# Patient Record
Sex: Female | Born: 2000 | Race: Black or African American | Hispanic: No | Marital: Single | Smoking: Never smoker
Health system: Southern US, Community
[De-identification: ages and names within clinical notes are randomized; demographics above are authoritative.]

## PROBLEM LIST (undated history)

## (undated) DIAGNOSIS — B009 Herpesviral infection, unspecified: Secondary | ICD-10-CM

## (undated) DIAGNOSIS — L0292 Furuncle, unspecified: Secondary | ICD-10-CM

## (undated) DIAGNOSIS — G43909 Migraine, unspecified, not intractable, without status migrainosus: Secondary | ICD-10-CM

## (undated) DIAGNOSIS — L02419 Cutaneous abscess of limb, unspecified: Secondary | ICD-10-CM

## (undated) DIAGNOSIS — L0293 Carbuncle, unspecified: Secondary | ICD-10-CM

## (undated) DIAGNOSIS — L659 Nonscarring hair loss, unspecified: Secondary | ICD-10-CM

## (undated) DIAGNOSIS — K649 Unspecified hemorrhoids: Secondary | ICD-10-CM

## (undated) DIAGNOSIS — R87629 Unspecified abnormal cytological findings in specimens from vagina: Secondary | ICD-10-CM

## (undated) HISTORY — DX: Unspecified hemorrhoids: K64.9

## (undated) HISTORY — DX: Unspecified abnormal cytological findings in specimens from vagina: R87.629

## (undated) HISTORY — PX: INCISION AND DRAINAGE: SHX5863

## (undated) HISTORY — DX: Herpesviral infection, unspecified: B00.9

## (undated) HISTORY — DX: Migraine, unspecified, not intractable, without status migrainosus: G43.909

---
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
## 2022-09-06 LAB — OB RESULTS CONSOLE GC/CHLAMYDIA
Chlamydia: NEGATIVE
Neisseria Gonorrhea: NEGATIVE

## 2022-09-06 LAB — OB RESULTS CONSOLE HEPATITIS B SURFACE ANTIGEN: Hepatitis B Surface Ag: NEGATIVE

## 2022-09-06 LAB — OB RESULTS CONSOLE ANTIBODY SCREEN: Antibody Screen: NEGATIVE

## 2022-09-06 LAB — OB RESULTS CONSOLE HIV ANTIBODY (ROUTINE TESTING): HIV: NONREACTIVE

## 2022-09-06 LAB — OB RESULTS CONSOLE RPR: RPR: NONREACTIVE

## 2022-09-06 LAB — OB RESULTS CONSOLE ABO/RH: RH Type: POSITIVE

## 2022-09-06 LAB — HEPATITIS C ANTIBODY: HCV Ab: NEGATIVE

## 2022-09-06 LAB — OB RESULTS CONSOLE RUBELLA ANTIBODY, IGM: Rubella: IMMUNE

## 2022-10-30 NOTE — L&D Delivery Note (Signed)
Delivery Note At 11:04 AM a viable female was delivered via Vaginal, Spontaneous (Presentation: Left Occiput Anterior).  APGAR: 9, 9; weight  .   Placenta status: Spontaneous, Intact.  Cord: 3 vessels with the following complications:   Nuchal cord noted and reduced mid delivery  Anesthesia: Epidural Episiotomy: None Lacerations: 1st degree Suture Repair: 2.0 vicryl rapide Est. Blood Loss (mL):  250 cc  Mom to postpartum.  Baby to Couplet care / Skin to Skin.  Placenta to path for IUGR  [name] 03/23/2023, 11:24 AM

## 2022-12-14 ENCOUNTER — Other Ambulatory Visit: Payer: Self-pay | Admitting: Obstetrics and Gynecology

## 2022-12-14 DIAGNOSIS — Z363 Encounter for antenatal screening for malformations: Secondary | ICD-10-CM

## 2022-12-19 ENCOUNTER — Encounter: Payer: Self-pay | Admitting: *Deleted

## 2022-12-21 ENCOUNTER — Ambulatory Visit: Payer: BLUE CROSS/BLUE SHIELD | Attending: Obstetrics and Gynecology

## 2022-12-21 ENCOUNTER — Other Ambulatory Visit: Payer: Self-pay | Admitting: *Deleted

## 2022-12-21 ENCOUNTER — Encounter: Payer: Self-pay | Admitting: *Deleted

## 2022-12-21 ENCOUNTER — Ambulatory Visit: Payer: BLUE CROSS/BLUE SHIELD | Admitting: *Deleted

## 2022-12-21 VITALS — BP 118/63 | HR 90

## 2022-12-21 DIAGNOSIS — Z363 Encounter for antenatal screening for malformations: Secondary | ICD-10-CM | POA: Diagnosis not present

## 2022-12-21 DIAGNOSIS — Z3689 Encounter for other specified antenatal screening: Secondary | ICD-10-CM | POA: Diagnosis present

## 2022-12-21 DIAGNOSIS — O35BXX0 Maternal care for other (suspected) fetal abnormality and damage, fetal cardiac anomalies, not applicable or unspecified: Secondary | ICD-10-CM | POA: Diagnosis not present

## 2022-12-21 DIAGNOSIS — Z3A27 27 weeks gestation of pregnancy: Secondary | ICD-10-CM

## 2022-12-21 DIAGNOSIS — O365920 Maternal care for other known or suspected poor fetal growth, second trimester, not applicable or unspecified: Secondary | ICD-10-CM

## 2022-12-21 DIAGNOSIS — O283 Abnormal ultrasonic finding on antenatal screening of mother: Secondary | ICD-10-CM

## 2023-01-15 ENCOUNTER — Encounter: Payer: Self-pay | Admitting: *Deleted

## 2023-01-15 ENCOUNTER — Ambulatory Visit: Payer: BLUE CROSS/BLUE SHIELD | Admitting: *Deleted

## 2023-01-15 ENCOUNTER — Ambulatory Visit: Payer: BLUE CROSS/BLUE SHIELD | Attending: Maternal & Fetal Medicine

## 2023-01-15 DIAGNOSIS — Z3689 Encounter for other specified antenatal screening: Secondary | ICD-10-CM | POA: Insufficient documentation

## 2023-01-15 DIAGNOSIS — O35BXX0 Maternal care for other (suspected) fetal abnormality and damage, fetal cardiac anomalies, not applicable or unspecified: Secondary | ICD-10-CM

## 2023-01-15 DIAGNOSIS — Z3A30 30 weeks gestation of pregnancy: Secondary | ICD-10-CM | POA: Diagnosis not present

## 2023-01-15 DIAGNOSIS — O283 Abnormal ultrasonic finding on antenatal screening of mother: Secondary | ICD-10-CM | POA: Diagnosis present

## 2023-01-15 DIAGNOSIS — O365930 Maternal care for other known or suspected poor fetal growth, third trimester, not applicable or unspecified: Secondary | ICD-10-CM | POA: Diagnosis not present

## 2023-01-15 NOTE — Congregational Nurse Program (Signed)
  Dept: [phone]   Congregational Nurse Program Note  Date of Encounter: 12/31/2022  Past Medical History: Past Medical History:  Diagnosis Date   Abscess of axilla 07/2012   multiple abscesses of both axilla - one axilla is open and draining   Alopecia    Hemorrhoids    HSV infection    Migraines    Recurrent boils    Vaginal Pap smear, abnormal     Encounter Details:  CNP Questionnaire - 01/15/23 1724       Questionnaire   Ask client: Do you give verbal consent for me to treat you today? Yes    Student Assistance N/A    Location Patient [place]    Visit Setting with Client [place]    Patient Status Unknown    Sport and exercise psychologist or [place]    Insurance/Financial Assistance Referral N/A    Medication N/A    Medical Provider Yes    Screening Referrals Made N/A    Medical Referrals Made N/A    Medical Appointment Made N/A    Recently w/o PCP, now 1st time PCP visit completed due to CNs referral or appointment made N/A    Food N/A    Transportation N/A    Housing/Utilities N/A    Interpersonal Safety N/A    Interventions Advocate/Support;Educate;Spiritual Care;Counsel    Abnormal to Normal Screening Since Last CN Visit N/A    Screenings CN Performed Blood Pressure;Pulse Ox;Temperature    Sent Client to Lab for: N/A    Did client attend any of the following based off CNs referral or appointments made? N/A    ED Visit Averted N/A    Life-Saving Intervention Made N/A             Today's Vitals   12/31/22 1315  BP: 106/62  Pulse: 83  Resp: 18  Temp: 97.9 F (36.6 C)  TempSrc: Temporal  SpO2: 100%  PainSc: 0-No pain   There is no height or weight on file to calculate BMI.  Patient came into clinic to congregational nurse. Observed patient was pregnant. Patient  states due date Mar 21, 2023. Spiritual care given. Discussed with patient the transitions of life. Patient expressed having some round ligament pain. Education given to  assist with round ligament pain. Encouraged patient to discuss with PCP about inability to sleep at night. Encouraged patient to have decreased caffeine while pregnant. Dietary education given.

## 2023-02-27 LAB — OB RESULTS CONSOLE GBS: GBS: POSITIVE

## 2023-03-20 NOTE — H&P (Addendum)
Barbara Holland is a 22 y.o. female presenting for IOL. PNC complicated by SGA on early U/S. MFM U/S noted EFW 22% @ 30 wks. U/S in office 03/20/23 noted Vtx, EFW 6# 4oz (4%), AC <3%, AFI 55%, BPP 8/8. 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 OB History     Gravida  1   Para  0   Term  0   Preterm  0   AB  0   Living  0      SAB  0   IAB  0   Ectopic  0   Multiple  0   Live Births  0          Past Medical History:  Diagnosis Date   Abscess of axilla 07/2012   multiple abscesses of both axilla - one axilla is open and draining   Alopecia    Hemorrhoids    HSV infection    Migraines    Recurrent boils    Vaginal Pap smear, abnormal    Past Surgical History:  Procedure Laterality Date   INCISION AND DRAINAGE     for boils   MASS EXCISION  08/29/2012   Procedure: EXCISION MASS;  Surgeon: Judie Petit. Leonia Corona, MD;  Location: [facility];  Service: Pediatrics;  Laterality: Bilateral;  INCISION AND DEBRIDEMENT OF MULTIPLE ABSCESSES BOTH AXILLAS    Family History: family history includes Cancer (age of onset: 24) in her paternal grandfather; Diabetes in her maternal grandfather, maternal grandmother, and mother; Heart disease in her maternal grandfather, maternal grandmother, and paternal grandmother; Hyperlipidemia in her mother; Hypertension in her maternal grandfather, maternal grandmother, and mother; Kidney disease in her maternal grandmother; Sickle cell trait in her paternal uncle; Stroke in her maternal grandfather and maternal grandmother; Thyroid disease in her mother. Social History:  reports that she has never smoked. She has never used smokeless tobacco. She reports that she does not drink alcohol and does not use drugs.     Maternal Diabetes: No Genetic Screening: Declined Maternal Ultrasounds/Referrals: IUGR Fetal  Ultrasounds or other Referrals:  Referred to Materal Fetal Medicine  Maternal Substance Abuse:  No Significant Maternal Medications:  Meds include: Other: Valtrex Significant Maternal Lab Results:  Group B Strep positive Number of Prenatal Visits:greater than 3 verified prenatal visits Other Comments:  None  Review of Systems  Constitutional:  Negative for fever.  Eyes:  Negative for visual disturbance.  Neurological:  Negative for headaches.   Maternal Medical History:  Fetal activity: Perceived fetal activity is normal.       Last menstrual period 06/18/2022. Maternal Exam:  Abdomen: Fetal presentation: vertex   Physical Exam Cardiovascular:     Rate and Rhythm: Normal rate.  Pulmonary:     Effort: Pulmonary effort is normal.    Cx 1/30/-3 in office  Prenatal labs: ABO, Rh:   Antibody:   Rubella:   RPR:    HBsAg:    HIV:    GBS:   positive 02/27/23  Assessment/Plan: 22 yo G1P0 IUGR IOL   Barbara Holland 03/20/2023, 5:00 PM

## 2023-03-21 ENCOUNTER — Telehealth (HOSPITAL_COMMUNITY): Payer: Self-pay | Admitting: *Deleted

## 2023-03-21 NOTE — Telephone Encounter (Unsigned)
Preadmission screen  

## 2023-03-22 ENCOUNTER — Other Ambulatory Visit: Payer: Self-pay

## 2023-03-22 ENCOUNTER — Inpatient Hospital Stay (HOSPITAL_COMMUNITY)
Admission: RE | Admit: 2023-03-22 | Discharge: 2023-03-25 | DRG: 806 | Disposition: A | Discharge status: Home / Self Care | Payer: BLUE CROSS/BLUE SHIELD | Attending: Obstetrics and Gynecology | Admitting: Obstetrics and Gynecology

## 2023-03-22 ENCOUNTER — Encounter (HOSPITAL_COMMUNITY): Payer: Self-pay | Admitting: Obstetrics and Gynecology

## 2023-03-22 ENCOUNTER — Inpatient Hospital Stay (HOSPITAL_COMMUNITY): Payer: BLUE CROSS/BLUE SHIELD

## 2023-03-22 DIAGNOSIS — Z3A40 40 weeks gestation of pregnancy: Secondary | ICD-10-CM | POA: Diagnosis not present

## 2023-03-22 DIAGNOSIS — O99824 Streptococcus B carrier state complicating childbirth: Secondary | ICD-10-CM | POA: Diagnosis present

## 2023-03-22 DIAGNOSIS — O9832 Other infections with a predominantly sexual mode of transmission complicating childbirth: Secondary | ICD-10-CM | POA: Diagnosis present

## 2023-03-22 DIAGNOSIS — A6000 Herpesviral infection of urogenital system, unspecified: Secondary | ICD-10-CM | POA: Diagnosis present

## 2023-03-22 DIAGNOSIS — O365930 Maternal care for other known or suspected poor fetal growth, third trimester, not applicable or unspecified: Principal | ICD-10-CM | POA: Diagnosis present

## 2023-03-22 DIAGNOSIS — O365990 Maternal care for other known or suspected poor fetal growth, unspecified trimester, not applicable or unspecified: Principal | ICD-10-CM | POA: Diagnosis present

## 2023-03-22 LAB — RPR: RPR Ser Ql: NONREACTIVE

## 2023-03-22 LAB — CBC
HCT: 35.2 % — ABNORMAL LOW (ref 36.0–46.0)
Hemoglobin: 12.1 g/dL (ref 12.0–15.0)
MCH: 30.9 pg (ref 26.0–34.0)
MCHC: 34.4 g/dL (ref 30.0–36.0)
MCV: 90 fL (ref 80.0–100.0)
Platelets: 231 10*3/uL (ref 150–400)
RBC: 3.91 MIL/uL (ref 3.87–5.11)
RDW: 13.9 % (ref 11.5–15.5)
WBC: 9.5 10*3/uL (ref 4.0–10.5)
nRBC: 0 % (ref 0.0–0.2)

## 2023-03-22 LAB — TYPE AND SCREEN
ABO/RH(D): O POS
Antibody Screen: NEGATIVE

## 2023-03-22 MED ORDER — OXYCODONE-ACETAMINOPHEN 5-325 MG PO TABS: 2.0000 | ORAL_TABLET | ORAL | Status: DC | PRN

## 2023-03-22 MED ORDER — FLEET ENEMA 7-19 GM/118ML RE ENEM: 1.0000 | ENEMA | RECTAL | Status: DC | PRN

## 2023-03-22 MED ORDER — MISOPROSTOL 25 MCG QUARTER TABLET
25.0000 ug | ORAL_TABLET | ORAL | Status: DC
  Administered 2023-03-22: 25 ug via VAGINAL
  Filled 2023-03-22: qty 1

## 2023-03-22 MED ORDER — MISOPROSTOL 25 MCG QUARTER TABLET
25.0000 ug | ORAL_TABLET | ORAL | Status: DC | PRN
  Administered 2023-03-22: 25 ug via VAGINAL
  Filled 2023-03-22: qty 1

## 2023-03-22 MED ORDER — OXYTOCIN-SODIUM CHLORIDE 30-0.9 UT/500ML-% IV SOLN
2.5000 [IU]/h | INTRAVENOUS | Status: DC
  Administered 2023-03-23: 2.5 [IU]/h via INTRAVENOUS

## 2023-03-22 MED ORDER — TERBUTALINE SULFATE 1 MG/ML IJ SOLN: 0.2500 mg | Freq: Once | INTRAMUSCULAR | Status: DC | PRN

## 2023-03-22 MED ORDER — PHENYLEPHRINE 80 MCG/ML (10ML) SYRINGE FOR IV PUSH (FOR BLOOD PRESSURE SUPPORT): 80.0000 ug | PREFILLED_SYRINGE | INTRAVENOUS | Status: DC | PRN

## 2023-03-22 MED ORDER — DIPHENHYDRAMINE HCL 50 MG/ML IJ SOLN: 12.5000 mg | INTRAMUSCULAR | Status: DC | PRN

## 2023-03-22 MED ORDER — EPHEDRINE 5 MG/ML INJ: 10.0000 mg | INTRAVENOUS | Status: DC | PRN

## 2023-03-22 MED ORDER — LACTATED RINGERS IV SOLN: 500.0000 mL | INTRAVENOUS | Status: DC | PRN

## 2023-03-22 MED ORDER — VALACYCLOVIR HCL 500 MG PO TABS
500.0000 mg | ORAL_TABLET | Freq: Two times a day (BID) | ORAL | Status: DC
  Administered 2023-03-22 – 2023-03-25 (×6): 500 mg via ORAL
  Filled 2023-03-22 (×6): qty 1

## 2023-03-22 MED ORDER — LIDOCAINE HCL (PF) 1 % IJ SOLN: 30.0000 mL | INTRAMUSCULAR | Status: DC | PRN

## 2023-03-22 MED ORDER — OXYTOCIN-SODIUM CHLORIDE 30-0.9 UT/500ML-% IV SOLN
1.0000 m[IU]/min | INTRAVENOUS | Status: DC
  Administered 2023-03-22: 2 m[IU]/min via INTRAVENOUS
  Filled 2023-03-22: qty 500

## 2023-03-22 MED ORDER — OXYCODONE-ACETAMINOPHEN 5-325 MG PO TABS: 1.0000 | ORAL_TABLET | ORAL | Status: DC | PRN

## 2023-03-22 MED ORDER — LACTATED RINGERS IV SOLN
500.0000 mL | Freq: Once | INTRAVENOUS | Status: AC
  Administered 2023-03-23: 500 mL via INTRAVENOUS

## 2023-03-22 MED ORDER — FENTANYL CITRATE (PF) 100 MCG/2ML IJ SOLN
50.0000 ug | INTRAMUSCULAR | Status: DC | PRN
  Administered 2023-03-22 – 2023-03-23 (×4): 100 ug via INTRAVENOUS
  Filled 2023-03-22 (×4): qty 2

## 2023-03-22 MED ORDER — OXYTOCIN BOLUS FROM INFUSION
333.0000 mL | Freq: Once | INTRAVENOUS | Status: AC
  Administered 2023-03-23: 333 mL via INTRAVENOUS

## 2023-03-22 MED ORDER — PENICILLIN G POT IN DEXTROSE 60000 UNIT/ML IV SOLN
3.0000 10*6.[IU] | INTRAVENOUS | Status: DC
  Administered 2023-03-22 – 2023-03-23 (×6): 3 10*6.[IU] via INTRAVENOUS
  Filled 2023-03-22 (×6): qty 50

## 2023-03-22 MED ORDER — LACTATED RINGERS IV SOLN: INTRAVENOUS | Status: DC

## 2023-03-22 MED ORDER — ONDANSETRON HCL 4 MG/2ML IJ SOLN: 4.0000 mg | Freq: Four times a day (QID) | INTRAMUSCULAR | Status: DC | PRN

## 2023-03-22 MED ORDER — ACETAMINOPHEN 325 MG PO TABS: 650.0000 mg | ORAL_TABLET | ORAL | Status: DC | PRN

## 2023-03-22 MED ORDER — FENTANYL-BUPIVACAINE-NACL 0.5-0.125-0.9 MG/250ML-% EP SOLN
12.0000 mL/h | EPIDURAL | Status: DC | PRN
  Administered 2023-03-23: 12 mL/h via EPIDURAL
  Filled 2023-03-22: qty 250

## 2023-03-22 MED ORDER — SODIUM CHLORIDE 0.9 % IV SOLN
5.0000 10*6.[IU] | Freq: Once | INTRAVENOUS | Status: AC
  Administered 2023-03-22: 5 10*6.[IU] via INTRAVENOUS
  Filled 2023-03-22: qty 5

## 2023-03-22 MED ORDER — SOD CITRATE-CITRIC ACID 500-334 MG/5ML PO SOLN: 30.0000 mL | ORAL | Status: DC | PRN

## 2023-03-22 NOTE — Progress Notes (Signed)
FHT cat one UCs irregular and mild Cx 1/50/-2/vtx/soft Will repeat misoprostol 25 mcg PV

## 2023-03-22 NOTE — Progress Notes (Addendum)
No change to H&P per patient history No leaking Last HSV outbreak 2-3 weeks ago. No prodromal sxs now  Today's Vitals   03/22/23 0700 03/22/23 0752 03/22/23 0807  BP:  122/74   Pulse:  99   Resp:  18   Temp:  98.1 F (36.7 C)   TempSrc:  Axillary   Weight: 105.7 kg    Height: 5\' 6"  (1.676 m)    PainSc:   0-No pain   Body mass index is 37.61 kg/m.   FHT cat one UCs irritability  Cx 1/20/-3/vtx/posterior/medium soft No vulvar lesions seen  Results for orders placed or performed during the hospital encounter of 03/22/23 (from the past 24 hour(s))  Type and screen [facility]     Status: None   Collection Time: 03/22/23  8:15 AM  Result Value Ref Range   ABO/RH(D) O POS    Antibody Screen NEG    Sample Expiration      03/25/2023,2359 Performed at [facility], [address] [place], [place] [address]   CBC     Status: Abnormal   Collection Time: 03/22/23  8:19 AM  Result Value Ref Range   WBC 9.5 4.0 - 10.5 K/uL   RBC 3.91 3.87 - 5.11 MIL/uL   Hemoglobin 12.1 12.0 - 15.0 g/dL   HCT 60.4 (L) 54.0 - 98.1 %   MCV 90.0 80.0 - 100.0 fL   MCH 30.9 26.0 - 34.0 pg   MCHC 34.4 30.0 - 36.0 g/dL   RDW 19.1 47.8 - 29.5 %   Platelets 231 150 - 400 K/uL   nRBC 0.0 0.0 - 0.2 %    A/P: IUGR         GBBS positive>PCN infusing         D/W two stage IOL. Will start with 25 mcg PV q4 hours         She states she understands and agrees         Valtrex 500mg  po qd

## 2023-03-22 NOTE — Plan of Care (Signed)

## 2023-03-22 NOTE — Progress Notes (Signed)
FHT cat one UCs q1-4 min Cx 1-2/80/-2 D/W patient>start pitocin augmentation

## 2023-03-23 ENCOUNTER — Encounter (HOSPITAL_COMMUNITY): Payer: Self-pay | Admitting: Obstetrics and Gynecology

## 2023-03-23 ENCOUNTER — Inpatient Hospital Stay (HOSPITAL_COMMUNITY): Payer: BLUE CROSS/BLUE SHIELD | Admitting: Anesthesiology

## 2023-03-23 MED ORDER — LIDOCAINE HCL (PF) 1 % IJ SOLN
INTRAMUSCULAR | Status: DC | PRN
  Administered 2023-03-23: 10 mL via EPIDURAL

## 2023-03-23 MED ORDER — ZOLPIDEM TARTRATE 5 MG PO TABS: 5.0000 mg | ORAL_TABLET | Freq: Every evening | ORAL | Status: DC | PRN

## 2023-03-23 MED ORDER — TETANUS-DIPHTH-ACELL PERTUSSIS 5-2.5-18.5 LF-MCG/0.5 IM SUSY: 0.5000 mL | PREFILLED_SYRINGE | Freq: Once | INTRAMUSCULAR | Status: DC

## 2023-03-23 MED ORDER — DIBUCAINE (PERIANAL) 1 % EX OINT: 1.0000 | TOPICAL_OINTMENT | CUTANEOUS | Status: DC | PRN

## 2023-03-23 MED ORDER — COCONUT OIL OIL: 1.0000 | TOPICAL_OIL | Status: DC | PRN

## 2023-03-23 MED ORDER — DIPHENHYDRAMINE HCL 25 MG PO CAPS: 25.0000 mg | ORAL_CAPSULE | Freq: Four times a day (QID) | ORAL | Status: DC | PRN

## 2023-03-23 MED ORDER — ONDANSETRON HCL 4 MG/2ML IJ SOLN: 4.0000 mg | INTRAMUSCULAR | Status: DC | PRN

## 2023-03-23 MED ORDER — ONDANSETRON HCL 4 MG PO TABS: 4.0000 mg | ORAL_TABLET | ORAL | Status: DC | PRN

## 2023-03-23 MED ORDER — WITCH HAZEL-GLYCERIN EX PADS: 1.0000 | MEDICATED_PAD | CUTANEOUS | Status: DC | PRN

## 2023-03-23 MED ORDER — PRENATAL MULTIVITAMIN CH
1.0000 | ORAL_TABLET | Freq: Every day | ORAL | Status: DC
  Administered 2023-03-23 – 2023-03-25 (×3): 1 via ORAL
  Filled 2023-03-23 (×3): qty 1

## 2023-03-23 MED ORDER — SIMETHICONE 80 MG PO CHEW: 80.0000 mg | CHEWABLE_TABLET | ORAL | Status: DC | PRN

## 2023-03-23 MED ORDER — BENZOCAINE-MENTHOL 20-0.5 % EX AERO
1.0000 | INHALATION_SPRAY | CUTANEOUS | Status: DC | PRN
  Administered 2023-03-23 – 2023-03-25 (×2): 1 via TOPICAL
  Filled 2023-03-23 (×2): qty 56

## 2023-03-23 MED ORDER — IBUPROFEN 600 MG PO TABS
600.0000 mg | ORAL_TABLET | Freq: Four times a day (QID) | ORAL | Status: DC
  Administered 2023-03-23 – 2023-03-25 (×8): 600 mg via ORAL
  Filled 2023-03-23 (×8): qty 1

## 2023-03-23 MED ORDER — ACETAMINOPHEN 325 MG PO TABS: 650.0000 mg | ORAL_TABLET | ORAL | Status: DC | PRN

## 2023-03-23 MED ORDER — SENNOSIDES-DOCUSATE SODIUM 8.6-50 MG PO TABS
2.0000 | ORAL_TABLET | ORAL | Status: DC
  Administered 2023-03-23 – 2023-03-24 (×2): 2 via ORAL
  Filled 2023-03-23 (×2): qty 2

## 2023-03-23 NOTE — Progress Notes (Signed)
pushing

## 2023-03-23 NOTE — Anesthesia Procedure Notes (Signed)
Epidural Patient location during procedure: OB Start time: 03/23/2023 6:45 AM End time: 03/23/2023 6:56 AM  Staffing Anesthesiologist: Lannie Fields, DO Performed: anesthesiologist   Preanesthetic Checklist Completed: patient identified, IV checked, risks and benefits discussed, monitors and equipment checked, pre-op evaluation and timeout performed  Epidural Patient position: sitting Prep: DuraPrep and site prepped and draped Patient monitoring: continuous pulse ox, blood pressure, heart rate and cardiac monitor Approach: midline Location: L3-L4 Injection technique: LOR air  Needle:  Needle type: Tuohy  Needle gauge: 17 G Needle length: 9 cm Needle insertion depth: 9 cm Catheter type: closed end flexible Catheter size: 19 Gauge Catheter at skin depth: 15 cm Test dose: negative  Assessment Sensory level: T8 Events: blood not aspirated, no cerebrospinal fluid, injection not painful, no injection resistance, no paresthesia and negative IV test  Additional Notes Patient identified. Risks/Benefits/Options discussed with patient including but not limited to bleeding, infection, nerve damage, paralysis, failed block, incomplete pain control, headache, blood pressure changes, nausea, vomiting, reactions to medication both or allergic, itching and postpartum back pain. Confirmed with bedside nurse the patient's most recent platelet count. Confirmed with patient that they are not currently taking any anticoagulation, have any bleeding history or any family history of bleeding disorders. Patient expressed understanding and wished to proceed. All questions were answered. Sterile technique was used throughout the entire procedure. Please see nursing notes for vital signs. Test dose was given through epidural catheter and negative prior to continuing to dose epidural or start infusion. Warning signs of high block given to the patient including shortness of breath, tingling/numbness in  hands, complete motor block, or any concerning symptoms with instructions to call for help. Patient was given instructions on fall risk and not to get out of bed. All questions and concerns addressed with instructions to call with any issues or inadequate analgesia.  Reason for block:procedure for pain

## 2023-03-23 NOTE — Anesthesia Preprocedure Evaluation (Signed)
Anesthesia Evaluation  Patient identified by MRN, date of birth, ID band Patient awake    Reviewed: Allergy & Precautions, H&P , NPO status , Patient's Chart, lab work & pertinent test results  Airway Mallampati: II  TM Distance: >3 FB Neck ROM: Full    Dental  (+) Dental Advisory Given   Pulmonary neg pulmonary ROS   Pulmonary exam normal breath sounds clear to auscultation       Cardiovascular negative cardio ROS Normal cardiovascular exam Rhythm:Regular Rate:Normal     Neuro/Psych  Headaches  negative psych ROS   GI/Hepatic negative GI ROS, Neg liver ROS,,,  Endo/Other  BMI 38  Renal/GU negative Renal ROS  negative genitourinary   Musculoskeletal negative musculoskeletal ROS (+)    Abdominal  (+) + obese  Peds negative pediatric ROS (+)  Hematology negative hematology ROS (+)   Anesthesia Other Findings   Reproductive/Obstetrics negative OB ROS                             Anesthesia Physical Anesthesia Plan  ASA: 3  Anesthesia Plan: Epidural   Post-op Pain Management:    Induction:   PONV Risk Score and Plan: 2  Airway Management Planned: Natural Airway  Additional Equipment: None  Intra-op Plan:   Post-operative Plan:   Informed Consent: I have reviewed the patients History and Physical, chart, labs and discussed the procedure including the risks, benefits and alternatives for the proposed anesthesia with the patient or authorized representative who has indicated his/her understanding and acceptance.       Plan Discussed with:   Anesthesia Plan Comments:        Anesthesia Quick Evaluation

## 2023-03-23 NOTE — Progress Notes (Signed)
RN at bedside applying counter pressure for pain management [id number].

## 2023-03-23 NOTE — Lactation Note (Signed)
This note was copied from a baby's chart. Lactation Consultation Note  Patient Name: Barbara Holland [id number] Date: 03/23/2023 Age:24 hours Reason for consult: Initial assessment;1st time breastfeeding;Term. See Birth Parent MR.  Per Birth Parent, infant been sleepy past time for a feeding. LC discussed doing skin to skin and allow infant to suckle on clean finger to stimulate the rooting reflex. LC used breast model to teach hand expression and Birth Parent self expressed a few drops of colostrum that was spoon fed to infant. Infant became more alert. LC observed Birth Parent has cut on her right breast around the nipple and she had painful latches with some of the feeding. LC ask Birth Parent to hand express few drops of colostrum prior to latch and gave Birth Parent pillow support for herself and infant. Birth Parent latched infant with depth on her right breast using the cross cradle hold and felt latch was better without pain. Infant was still Breastfeeding after 15 minutes when LC left the room.  Birth Parent  knows if infant is not latching well, if she is experiencing pain or discomfort at the breast  to break latch and re- latch infant at the breast and to ask for further latch assistance if needed. LC discussed infant's input and output. The importance of maternal rest, diet and hydration.Birth Parent was made aware of O/P services, breastfeeding support groups, community resources, and our phone # for post-discharge questions.    Birth Parent feeding plan: 1- Birth Parent will continue to BF infant according to hunger cues, on demand, 8 to 12+ times within 24 hours, STS. 2- Birth Parent knows to call RN/LC for further latch assistance if needed. 3- Family member will bring organic coconut oil to apply to breast or use EBM to help with healing and soreness.       Maternal Data Has patient been taught Hand Expression?: Yes Does the patient have breastfeeding experience prior to this  delivery?: No  Feeding Mother's Current Feeding Choice: Breast Milk  LATCH Score Latch: Grasps breast easily, tongue down, lips flanged, rhythmical sucking.  Audible Swallowing: Spontaneous and intermittent  Type of Nipple: Everted at rest and after stimulation  Comfort (Breast/Nipple): Engorged, cracked, bleeding, large blisters, severe discomfort  Hold (Positioning): Assistance needed to correctly position infant at breast and maintain latch.  LATCH Score: 7   Lactation Tools Discussed/Used    Interventions Interventions: Support pillows;Adjust position;Breast feeding basics reviewed;Assisted with latch;Skin to skin;Position options;Education;Breast massage;Expressed milk;Breast compression;LC Services brochure  Discharge Pump: DEBP;Personal;Hands Free (Per Birth Parent, she has Motiff DEBP at home.)  Consult Status Consult Status: Follow-up Date: 03/24/23 Follow-up type: In-patient    Frederico Hamman 03/23/2023, 7:31 PM

## 2023-03-23 NOTE — Plan of Care (Signed)

## 2023-03-23 NOTE — Progress Notes (Signed)
FHT cat one UCs q 2-4 min Pitocin infusing Cx 2/90/-2/mid position AROM> clear

## 2023-03-24 LAB — CBC
HCT: 28.9 % — ABNORMAL LOW (ref 36.0–46.0)
Hemoglobin: 10 g/dL — ABNORMAL LOW (ref 12.0–15.0)
MCH: 31.3 pg (ref 26.0–34.0)
MCHC: 34.6 g/dL (ref 30.0–36.0)
MCV: 90.6 fL (ref 80.0–100.0)
Platelets: 210 10*3/uL (ref 150–400)
RBC: 3.19 MIL/uL — ABNORMAL LOW (ref 3.87–5.11)
RDW: 13.7 % (ref 11.5–15.5)
WBC: 11.5 10*3/uL — ABNORMAL HIGH (ref 4.0–10.5)
nRBC: 0 % (ref 0.0–0.2)

## 2023-03-24 MED ORDER — IBUPROFEN 600 MG PO TABS: 600.0000 mg | ORAL_TABLET | Freq: Four times a day (QID) | ORAL | 0 refills | Status: AC

## 2023-03-24 MED ORDER — ACETAMINOPHEN 325 MG PO TABS: 650.0000 mg | ORAL_TABLET | ORAL | 0 refills | Status: AC | PRN

## 2023-03-24 NOTE — Progress Notes (Signed)
Postpartum Progress Note  Post Partum Day 1 s/p spontaneous vaginal delivery.  Patient reports well-controlled pain, ambulating without difficulty, voiding spontaneously, tolerating PO.  Vaginal bleeding is appropriate.   Objective: Blood pressure 116/84, pulse 68, temperature 98.4 F (36.9 C), temperature source Oral, resp. rate 18, height 5\' 6"  (1.676 m), weight 105.7 kg, last menstrual period 06/18/2022, SpO2 100 %, unknown if currently breastfeeding.  Physical Exam:  General: alert and no distress Lochia: appropriate Uterine Fundus: firm DVT Evaluation: No evidence of DVT seen on physical exam.  Recent Labs    03/22/23 0819 03/24/23 0510  HGB 12.1 10.0*  HCT 35.2* 28.9*    Assessment/Plan: Postpartum Day 1, s/p vaginal delivery. Continue routine postpartum care Lactation following Anticipate discharge home today   LOS: 2 days   Barbara Holland 03/24/2023, 6:48 AM

## 2023-03-24 NOTE — Anesthesia Postprocedure Evaluation (Signed)
Anesthesia Post Note  Patient: Barbara Holland  Procedure(s) Performed: AN AD HOC LABOR EPIDURAL     Patient location during evaluation: Mother Baby Anesthesia Type: Epidural Level of consciousness: awake and alert Pain management: pain level controlled Vital Signs Assessment: post-procedure vital signs reviewed and stable Respiratory status: spontaneous breathing, nonlabored ventilation and respiratory function stable Cardiovascular status: stable Postop Assessment: no headache, no backache, epidural receding and able to ambulate Anesthetic complications: no   No notable events documented.  Last Vitals:  Vitals:   03/23/23 2139 03/24/23 0549  BP: 103/67 116/84  Pulse: 65 68  Resp: 18 18  Temp: 36.9 C 36.9 C  SpO2: 100% 100%    Last Pain:  Vitals:   03/24/23 0930  TempSrc:   PainSc: 0-No pain   Pain Goal:                   Venola Castello

## 2023-03-24 NOTE — Progress Notes (Signed)
CSW received consult for MOB due to her Edinburgh score of 13. CSW met with MOB at bedside for discussion - FOB present so CSW obtained permission to speak with him present. MOB states she does not have a history of anxiety but she has recently developed symptoms. MOB reports she does not have a therapist but wants to pursue getting one. MOB reports she has a great support system. MOB reports she has [employer] and will utilize the [employer] portal to find a clinician. MOB reports her current mood is stable and she denies any intrusive thoughts. MOB denies any use of psychotropic medications. MOB reports she is happy and confident in herself as a new mom. MOB denies any questions or concerns at this time - CSW encouraged MOB to reach out if needs arise and she is agreeable.  Edwin Dada, MSW, LCSW Transitions of Care  Clinical Social Worker II [phone]

## 2023-03-24 NOTE — Plan of Care (Signed)
Verbalizes need for latching support. Discussed techniques with Barbara Holland and assisted her with latching on. Emotional and concerned infant is not getting enough because infant has been cluster feeding and her nipples are sore. Family to bring in coconut oil. RN went over manual pump and gave her the proper size phalange to fit nipples 27cm.

## 2023-03-25 MED ORDER — DONOR BREAST MILK (FOR LABEL PRINTING ONLY): ORAL | Status: DC

## 2023-03-25 MED ORDER — VALACYCLOVIR HCL 500 MG PO TABS: 500.0000 mg | ORAL_TABLET | Freq: Two times a day (BID) | ORAL | 0 refills | Status: AC

## 2023-03-25 NOTE — Progress Notes (Signed)
Postpartum Progress Note  Post Partum Day 2 s/p spontaneous vaginal delivery.  Patient reports well-controlled pain, ambulating without difficulty, voiding spontaneously, tolerating PO.  Vaginal bleeding is appropriate.   Objective: Blood pressure 129/78, pulse 63, temperature 98.6 F (37 C), temperature source Oral, resp. rate 18, height 5\' 6"  (1.676 m), weight 105.7 kg, last menstrual period 06/18/2022, SpO2 98 %, unknown if currently breastfeeding.  Physical Exam:  General: alert and no distress Lochia: appropriate Uterine Fundus: firm DVT Evaluation: No evidence of DVT seen on physical exam.  Recent Labs    03/22/23 0819 03/24/23 0510  HGB 12.1 10.0*  HCT 35.2* 28.9*     Assessment/Plan: Postpartum Day 2, s/p vaginal delivery. Stayed for baby's feeding. Continue routine postpartum care Lactation following Anticipate discharge home today   LOS: 3 days   [name] 03/25/2023, 7:44 AM

## 2023-03-25 NOTE — Lactation Note (Addendum)
This note was copied from a baby's chart. Lactation Consultation Note  Patient Name: Barbara Holland [id number] Date: 03/25/2023 Age:24 hours Reason for consult: Mother's request;Primapara;1st time breastfeeding;Follow-up assessment LC in to room, parents are expecting discharge. Lactating parent (LP) is supplementing with donor milk after breastfeeding for 10 minutes. LC reviewed pacing and checked flange size with manual pump. Parent can use 21-mm or smaller flange. Discussed supplementation with EBM/DM/formula. Parents have volume guidelines for supplementing after breastfeeding.  Discussed expected behavior and patterns, voids and stools as signs good intake,  skin to skin. Talked about how to manage engorgement. Demonstrated nipple care  with expressed milk, nipple balm/coconut oil, or comfort gels.  Plan: 1-Feeding on demand or 8-12 times in 24h period. 2-Hand express/pump as needed for supplementation. 3-Follow guidelines for supplementation and pace feed in upright position with frequent burping. 3-Encouraged both parents to rest, stay hydrated and eat.   Contact LC as needed for feeds/support/concerns/questions. All questions answered at this time. Reviewed resources for breastfeeding support after discharge.    Maternal Data Has patient been taught Hand Expression?: Yes Does the patient have breastfeeding experience prior to this delivery?: No  Feeding Mother's Current Feeding Choice: Breast Milk and Donor Milk  LATCH Score Latch: Grasps breast easily, tongue down, lips flanged, rhythmical sucking.  Audible Swallowing: Spontaneous and intermittent  Type of Nipple: Everted at rest and after stimulation  Comfort (Breast/Nipple): Filling, red/small blisters or bruises, mild/mod discomfort  Hold (Positioning): No assistance needed to correctly position infant at breast.  LATCH Score: 9   Lactation Tools Discussed/Used Tools: Pump;Flanges Flange Size: 21 (may need  smaller) Breast pump type: Manual Pump Education: Milk Storage;Setup, frequency, and cleaning Reason for Pumping: stimulation and supplementation Pumping frequency: as needed Pumped volume: 1 mL (when flange fitting)  Interventions Interventions: Breast feeding basics reviewed;Skin to skin;Hand express;Breast massage;Coconut oil;Expressed milk;Hand pump;Education;Pace feeding  Discharge Discharge Education: Engorgement and breast care;Warning signs for feeding baby Pump: Personal;Manual WIC Program: No  Consult Status Consult Status: Complete Date: 03/25/23 Follow-up type: Call as needed    Tyshawna Alarid A Higuera Ancidey 03/25/2023, 12:18 PM

## 2023-03-26 NOTE — Discharge Summary (Signed)
Obstetric Discharge Summary  Barbara Holland is a 22 y.o. female that presented on 03/22/2023 for induction for 40 weeks for IUGR.  IUGR was previously seen on ultrasound, then resolved.  Repeat US in office suggested EFW of 4%ile and induction was recommended.Marland Kitchen  She was admitted to labor and delivery for her induction.  Her labor course was uncomplicated and she delivered a viable female infant on 03/23/23.  Her postpartum course was uncomplicated and on PPD#2, she reported well controlled pain, spontaneous voiding, ambulating without difficulty, and tolerating PO.  She was stable for discharge home on 03/25/23 with plans for in-office follow up.  Hemoglobin  Date Value Ref Range Status  03/24/2023 10.0 (L) 12.0 - 15.0 g/dL Final   HCT  Date Value Ref Range Status  03/24/2023 28.9 (L) 36.0 - 46.0 % Final    Physical Exam:  General: alert no distress Lochia: appropriate Uterine Fundus: firm DVT Evaluation: No evidence of DVT seen on physical exam.  Discharge Diagnoses: Term Pregnancy-delivered  Discharge Information: Date: 03/25/23 Activity: Pelvic rest, as tolerated Diet: routine Medications: Tylenol, motrin Condition: stable Instructions: Refer to practice specific booklet.  Discussed prior to discharge.  Discharge to: Home  Follow-up Information     [place] Follow up.   Why: Please follow up for a 6 week postpartum visit. Contact information: [address] [place] [address] [phone]                 Newborn Data: Live born female  Birth Weight: 6 lb 10.9 oz (3030 g) APGAR: 9, 9  Newborn Delivery   Birth date/time: 03/23/2023 11:04:00 Delivery type: Vaginal, Spontaneous      Home with mother.  [name] 03/26/2023, 7:40 AM

## 2023-03-27 LAB — SURGICAL PATHOLOGY

## 2023-03-29 ENCOUNTER — Inpatient Hospital Stay (HOSPITAL_COMMUNITY): Payer: BLUE CROSS/BLUE SHIELD

## 2023-03-31 ENCOUNTER — Telehealth (HOSPITAL_COMMUNITY): Payer: Self-pay | Admitting: *Deleted

## 2023-03-31 NOTE — Telephone Encounter (Signed)
Patient voiced no questions or concerns regarding her health at this time. EPDS not done. EPDS in hospital on 5/25 was 13. Score was sent to patient's provider on 5/28 for follow-up. RN asked patient if anyone has reached out regarding therapy. Patient stated, "I did have someone contact me. I am open to therapy. I have some free therapy sessions available to me through my work, so I want to start with that route and see how it goes. But, I'm feeling much better now. I was so overwhelmed with having her and being worried about feeding her. But my milk is in now and my partner is helping me. I'm doing much better." Patient voiced no questions or concerns regarding infant at this time. Patient requested outpatient lactation appointment number - provided by RN. RN reviewed ABCs of safe sleep. Patient verbalized understanding. Patient requested RN email information on hospital's postpartum classes and support groups. Email sent. Deforest Hoyles, RN, 03/31/23, [phone]
